# Patient Record
Sex: Male | Born: 1985 | Race: Black or African American | Hispanic: No | Marital: Single | Smoking: Never smoker
Health system: Southern US, Community
[De-identification: ages and names within clinical notes are randomized; demographics above are authoritative.]

---
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
## 2012-08-10 ENCOUNTER — Emergency Department: Payer: Self-pay | Admitting: Emergency Medicine

## 2012-08-10 LAB — URINALYSIS, COMPLETE
Bacteria: NONE SEEN
Bilirubin,UR: NEGATIVE
Blood: NEGATIVE
Glucose,UR: NEGATIVE mg/dL (ref 0–75)
Leukocyte Esterase: NEGATIVE
Nitrite: NEGATIVE
Ph: 6 (ref 4.5–8.0)
Protein: 30
RBC,UR: NONE SEEN /HPF (ref 0–5)
Specific Gravity: 1.032 (ref 1.003–1.030)
Squamous Epithelial: NONE SEEN
WBC UR: 1 /HPF (ref 0–5)

## 2012-08-10 LAB — CBC
HCT: 45 % (ref 40.0–52.0)
HGB: 15.8 g/dL (ref 13.0–18.0)
MCH: 35.6 pg — ABNORMAL HIGH (ref 26.0–34.0)
MCHC: 35.1 g/dL (ref 32.0–36.0)
MCV: 101 fL — ABNORMAL HIGH (ref 80–100)
Platelet: 182 10*3/uL (ref 150–440)
RBC: 4.44 10*6/uL (ref 4.40–5.90)
RDW: 12.4 % (ref 11.5–14.5)
WBC: 9.1 10*3/uL (ref 3.8–10.6)

## 2012-08-10 LAB — COMPREHENSIVE METABOLIC PANEL
Albumin: 4.5 g/dL (ref 3.4–5.0)
Alkaline Phosphatase: 78 U/L (ref 50–136)
Bilirubin,Total: 1.5 mg/dL — ABNORMAL HIGH (ref 0.2–1.0)
Calcium, Total: 9.1 mg/dL (ref 8.5–10.1)
Co2: 24 mmol/L (ref 21–32)
EGFR (African American): >60
EGFR (Non-African Amer.): >60
Osmolality: 274 (ref 275–301)
Potassium: 3.4 mmol/L — ABNORMAL LOW (ref 3.5–5.1)
SGOT(AST): 31 U/L (ref 15–37)
Sodium: 137 mmol/L (ref 136–145)

## 2012-08-10 LAB — LIPASE, BLOOD: Lipase: 98 U/L (ref 73–393)

## 2012-10-10 ENCOUNTER — Emergency Department: Payer: Self-pay | Admitting: Emergency Medicine

## 2012-11-01 ENCOUNTER — Emergency Department: Payer: Self-pay | Admitting: Emergency Medicine

## 2012-11-01 LAB — CBC
HCT: 44.4 % (ref 40.0–52.0)
HGB: 14.9 g/dL (ref 13.0–18.0)
MCH: 34.1 pg — ABNORMAL HIGH (ref 26.0–34.0)
MCHC: 33.7 g/dL (ref 32.0–36.0)
Platelet: 189 10*3/uL (ref 150–440)
WBC: 7.1 10*3/uL (ref 3.8–10.6)

## 2012-11-01 LAB — COMPREHENSIVE METABOLIC PANEL
Albumin: 4.3 g/dL (ref 3.4–5.0)
Alkaline Phosphatase: 88 U/L (ref 50–136)
Anion Gap: 8 (ref 7–16)
BUN: 15 mg/dL (ref 7–18)
Bilirubin,Total: 1.9 mg/dL — ABNORMAL HIGH (ref 0.2–1.0)
Chloride: 105 mmol/L (ref 98–107)
Co2: 25 mmol/L (ref 21–32)
Creatinine: 1.12 mg/dL (ref 0.60–1.30)
EGFR (African American): >60
EGFR (Non-African Amer.): >60
SGPT (ALT): 29 U/L (ref 12–78)
Sodium: 138 mmol/L (ref 136–145)
Total Protein: 8 g/dL (ref 6.4–8.2)

## 2013-06-12 ENCOUNTER — Emergency Department: Payer: Self-pay | Admitting: Emergency Medicine

## 2014-10-19 IMAGING — CR LEFT LITTLE FINGER 2+V
1 series · 3 of 3 positions shown · non-contrast
Comparison: none

REASON FOR EXAM: post reduction
COMMENTS:

PROCEDURE:     DXR - DXR FINGER PINKY 5TH DIGIT LT HA  - October 10, 2012  [DATE]
RESULT:     Findings: 3 views of the left fifth digit are provided. There is
interval reduction of the prior left fifth PIP dislocation. There is a tiny
chip fracture anteromedial to the fifth PIP joint.

[Series 5: x finger pa left · 0.14mm/px · 3 of 3 slices shown]
[im 1/3]
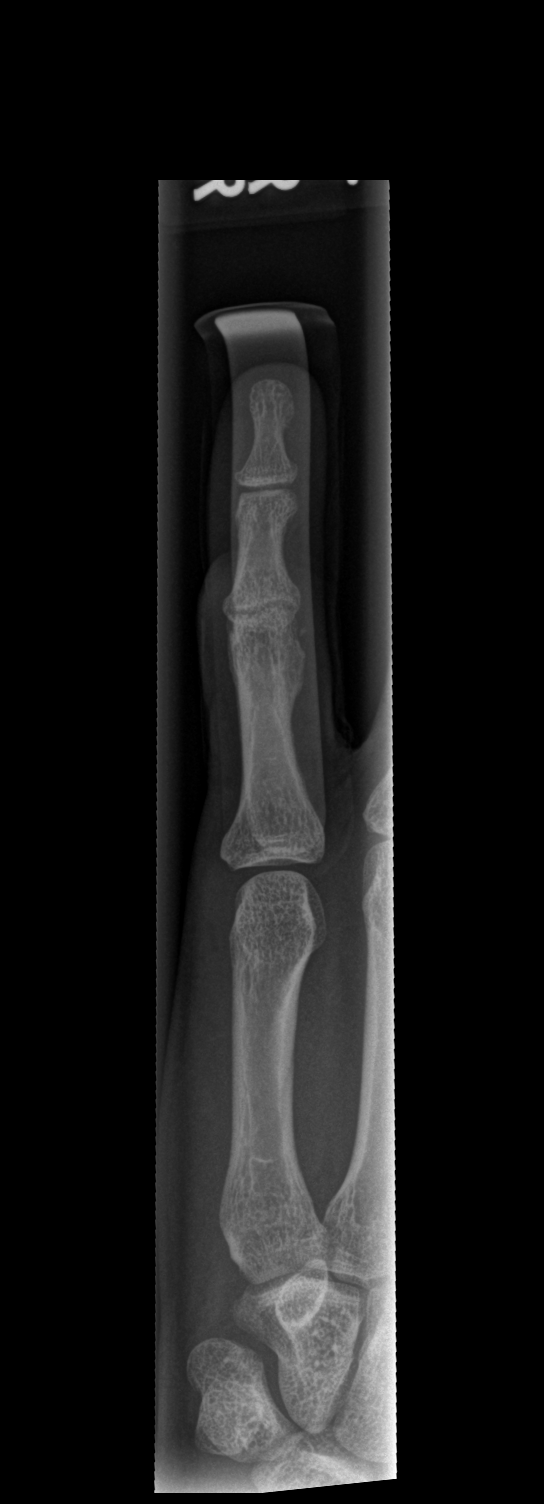
[im 2/3]
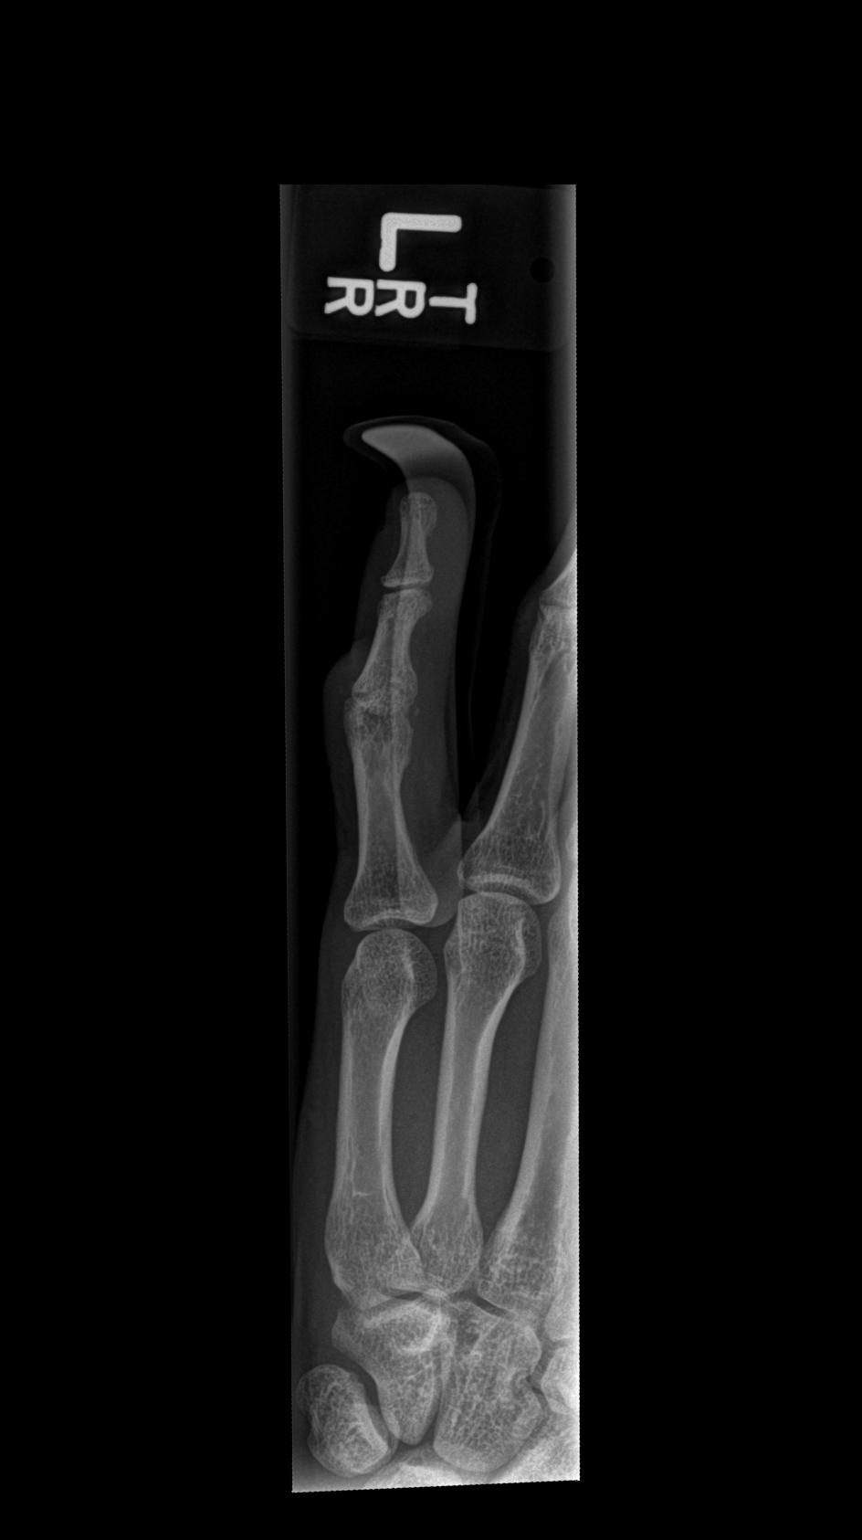
[im 3/3]
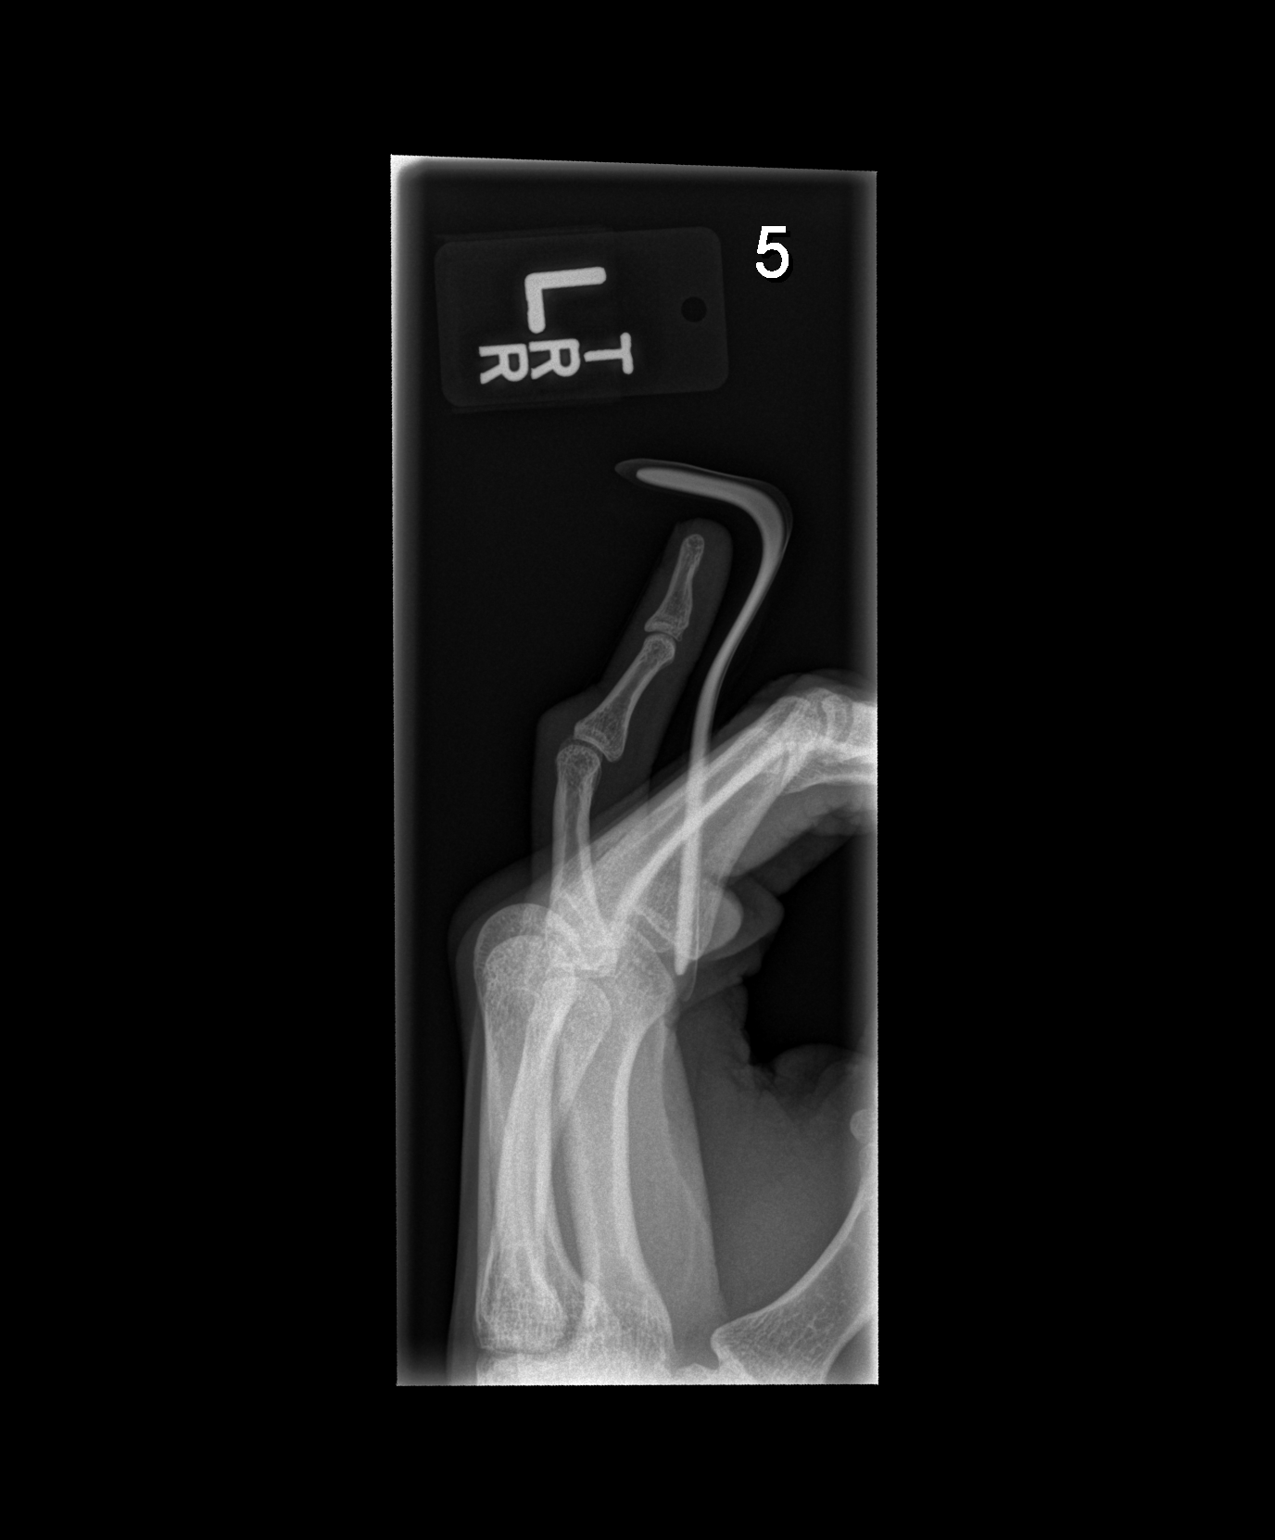

[3 of 3 positions shown; findings below may reference images not displayed]

IMPRESSION: Please see above.
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
## 2021-09-05 ENCOUNTER — Other Ambulatory Visit: Payer: Self-pay

## 2021-09-05 ENCOUNTER — Encounter: Payer: Self-pay | Admitting: Emergency Medicine

## 2021-09-05 ENCOUNTER — Emergency Department
Admission: EM | Admit: 2021-09-05 | Discharge: 2021-09-05 | Disposition: A | Discharge status: Home / Self Care | Payer: Self-pay | Attending: Emergency Medicine | Admitting: Emergency Medicine

## 2021-09-05 DIAGNOSIS — R112 Nausea with vomiting, unspecified: Secondary | ICD-10-CM | POA: Insufficient documentation

## 2021-09-05 DIAGNOSIS — F12188 Cannabis abuse with other cannabis-induced disorder: Secondary | ICD-10-CM | POA: Insufficient documentation

## 2021-09-05 DIAGNOSIS — F1290 Cannabis use, unspecified, uncomplicated: Secondary | ICD-10-CM

## 2021-09-05 LAB — COMPREHENSIVE METABOLIC PANEL
ALT: 48 U/L — ABNORMAL HIGH (ref 0–44)
AST: 45 U/L — ABNORMAL HIGH (ref 15–41)
Albumin: 4.7 g/dL (ref 3.5–5.0)
Alkaline Phosphatase: 81 U/L (ref 38–126)
Anion gap: 10 (ref 5–15)
BUN: 12 mg/dL (ref 6–20)
CO2: 26 mmol/L (ref 22–32)
Calcium: 9.8 mg/dL (ref 8.9–10.3)
Chloride: 101 mmol/L (ref 98–111)
Creatinine, Ser: 1.23 mg/dL (ref 0.61–1.24)
GFR, Estimated: >60 mL/min (ref >60)
Glucose, Bld: 115 mg/dL — ABNORMAL HIGH (ref 70–99)
Potassium: 3.8 mmol/L (ref 3.5–5.1)
Sodium: 137 mmol/L (ref 135–145)
Total Bilirubin: 1.5 mg/dL — ABNORMAL HIGH (ref 0.3–1.2)
Total Protein: 8.1 g/dL (ref 6.5–8.1)

## 2021-09-05 LAB — CBC
HCT: 49.7 % (ref 39.0–52.0)
Hemoglobin: 17.4 g/dL — ABNORMAL HIGH (ref 13.0–17.0)
MCH: 34.8 pg — ABNORMAL HIGH (ref 26.0–34.0)
MCHC: 35 g/dL (ref 30.0–36.0)
MCV: 99.4 fL (ref 80.0–100.0)
Platelets: 242 10*3/uL (ref 150–400)
RBC: 5 MIL/uL (ref 4.22–5.81)
RDW: 11.4 % — ABNORMAL LOW (ref 11.5–15.5)
WBC: 8.8 10*3/uL (ref 4.0–10.5)
nRBC: 0 % (ref 0.0–0.2)

## 2021-09-05 LAB — URINALYSIS, ROUTINE W REFLEX MICROSCOPIC
Bilirubin Urine: NEGATIVE
Glucose, UA: NEGATIVE mg/dL
Hgb urine dipstick: NEGATIVE
Ketones, ur: NEGATIVE mg/dL
Leukocytes,Ua: NEGATIVE
Nitrite: NEGATIVE
Protein, ur: NEGATIVE mg/dL
Specific Gravity, Urine: 1.02 (ref 1.005–1.030)
pH: 7 (ref 5.0–8.0)

## 2021-09-05 LAB — LIPASE, BLOOD: Lipase: 30 U/L (ref 11–51)

## 2021-09-05 MED ORDER — ONDANSETRON HCL 4 MG/2ML IJ SOLN
4.0000 mg | Freq: Once | INTRAMUSCULAR | Status: AC
  Administered 2021-09-05: 13:00:00 4 mg via INTRAVENOUS
  Filled 2021-09-05: qty 2

## 2021-09-05 MED ORDER — SODIUM CHLORIDE 0.9 % IV SOLN
25.0000 mg | Freq: Once | INTRAVENOUS | Status: AC
  Administered 2021-09-05: 15:00:00 25 mg via INTRAVENOUS
  Filled 2021-09-05: qty 1

## 2021-09-05 MED ORDER — CAPSAICIN 0.025 % EX CREA
1.0000 "application " | TOPICAL_CREAM | Freq: Once | CUTANEOUS | Status: AC
  Administered 2021-09-05: 1 via TOPICAL
  Filled 2021-09-05: qty 60

## 2021-09-05 MED ORDER — SODIUM CHLORIDE 0.9 % IV BOLUS
500.0000 mL | Freq: Once | INTRAVENOUS | Status: AC
  Administered 2021-09-05: 14:00:00 500 mL via INTRAVENOUS

## 2021-09-05 MED ORDER — ONDANSETRON 4 MG PO TBDP
4.0000 mg | ORAL_TABLET | Freq: Three times a day (TID) | ORAL | 0 refills | Status: AC | PRN
Start: 2021-09-05 — End: 2021-09-12

## 2021-09-05 NOTE — ED Triage Notes (Signed)
Pt comes into the ED via POV c/o emesis that started this morning.  Pt admits to some abd pain as well with a cramping pain.  Pt denies any alcohol consumption but admits to marijuana use yesterday.

## 2021-09-05 NOTE — ED Notes (Signed)
ED Provider at bedside. 

## 2021-09-05 NOTE — ED Notes (Signed)
Pt aware we need a urine sample and urinal provided.

## 2021-09-05 NOTE — ED Provider Notes (Signed)
[facility] Provider Note    Event Date/Time   First MD Initiated Contact with Patient 09/05/21 1239     (approximate)   History   Chief Complaint Emesis   HPI  Cesar Hanson is a 36 y.o. male, unremarkable medical history, presents emergency department for evaluation of abdominal pain and emesis.  Patient states that he began approximately 12 hours ago.  Reports crampy/colicky type pain in his abdomen with over 8 episodes of nonbilious/nonbloody vomiting.  Denies fever/chills, chest pain, shortness of breath, urinary symptoms, headache, or flank pain.  Of note, patient states that he routinely smokes marijuana twice per day and has been doing so for the past year.  History Limitations: No limitations.      Physical Exam  Triage Vital Signs: ED Triage Vitals  Enc Vitals Group     BP 09/05/21 1231 (!) 134/97     Pulse Rate 09/05/21 1231 91     Resp 09/05/21 1231 20     Temp 09/05/21 1231 98.1 F (36.7 C)     Temp Source 09/05/21 1231 Oral     SpO2 09/05/21 1231 99 %     Weight 09/05/21 1230 190 lb (86.2 kg)     Height 09/05/21 1230 5\' 11"  (1.803 m)     Head Circumference --      Peak Flow --      Pain Score 09/05/21 1229 10     Pain Loc --      Pain Edu? --      Excl. in GC? --     Most recent vital signs: Vitals:   09/05/21 1231 09/05/21 1537  BP: (!) 134/97 (!) 149/73  Pulse: 91 83  Resp: 20 18  Temp: 98.1 F (36.7 C)   SpO2: 99% 100%     Physical Exam Constitutional:      Appearance: Normal appearance.     Comments: Appears uncomfortable.  Actively vomiting.  Pulmonary:     Effort: Pulmonary effort is normal.  Abdominal:     Comments: Abdomen is soft and nontender.  No distention or masses.   Skin:    General: Skin is warm and dry.     Capillary Refill: Capillary refill takes less than 2 seconds.  Neurological:     Mental Status: He is alert. Mental status is at baseline.      ED Results / Procedures / Treatments   Labs (all labs ordered are listed, but only abnormal results are displayed) Labs Reviewed  COMPREHENSIVE METABOLIC PANEL - Abnormal; Notable for the following components:      Result Value   Glucose, Bld 115 (*)    AST 45 (*)    ALT 48 (*)    Total Bilirubin 1.5 (*)    All other components within normal limits  CBC - Abnormal; Notable for the following components:   Hemoglobin 17.4 (*)    MCH 34.8 (*)    RDW 11.4 (*)    All other components within normal limits  LIPASE, BLOOD  URINALYSIS, ROUTINE W REFLEX MICROSCOPIC     EKG Not applicable.   RADIOLOGY ED Provider Interpretation: Not applicable.  No results found.  PROCEDURES:  Critical Care performed: None.  Procedures    MEDICATIONS ORDERED IN ED: Medications  ondansetron (ZOFRAN) injection 4 mg (4 mg Intravenous Given 09/05/21 1305)  capsaicin (ZOSTRIX) 0.025 % cream 1 application (1 application Topical Given 09/05/21 1435)  sodium chloride 0.9 % bolus 500 mL (0 mLs Intravenous  Stopped 09/05/21 1543)  promethazine (PHENERGAN) 25 mg in sodium chloride 0.9 % 50 mL IVPB (0 mg Intravenous Stopped 09/05/21 1543)     IMPRESSION / MDM / ASSESSMENT AND PLAN / ED COURSE  I reviewed the triage vital signs and the nursing notes.                              Cesar Hanson is a 36 y.o. male, unremarkable medical history, presents emergency department for evaluation of abdominal pain and emesis.  Patient states that this began approximately 12 hours ago.  Reports crampy/colicky type pain in his abdomen with over 8 episodes of nonbilious/nonbloody vomiting.  Differential diagnosis includes, but is not limited to, gastroenteritis, cannabinoid hyperemesis syndrome, appendicitis, small bowel obstruction.  Patient appears uncomfortable and is actively vomiting, but does not appear ill or toxic.  Vital signs are within normal limits.  Abdominal exam is unremarkable.  CMP shows mildly elevated AST/ALT and bilirubin,  consistent with history of alcohol use, otherwise unremarkable.  CBC shows no evidence of leukocytosis or anemia.  Lipase unremarkable for evidence of pancreatitis.  Urinalysis shows no evidence of urinary tract infection.  We will go ahead and treat this patient with IV fluids and ondansetron.  Upon reexamination, patient endorses mild improvement of nausea but still no resolution.  We will go ahead and treat with promethazine and capsaicin cream.  History and physical exam suggestive of cannabinoid hyperemesis.  Physical exam not suggestive of any acute or life-threatening pathology.  Lab work-up has been reassuring.  No further imaging indicated at this time.  Upon further reexamination, patient states that his nausea has gone away.  We will plan to discharge this patient with a prescription for ondansetron.  Patient was provided with anticipatory guidance, return precautions, and educational material. Encouraged the patient to return to the emergency department at any time if they begin to experience any new or worsening symptoms.       FINAL CLINICAL IMPRESSION(S) / ED DIAGNOSES   Final diagnoses:  Cannabinoid hyperemesis syndrome     Rx / DC Orders   ED Discharge Orders          Ordered    ondansetron (ZOFRAN-ODT) 4 MG disintegrating tablet  Every 8 hours PRN        09/05/21 1518             Note:  This document was prepared using Dragon voice recognition software and may include unintentional dictation errors.   Varney Daily, [place] 09/05/21 2001    Jene Every, MD 09/06/21 [phone]

## 2021-09-05 NOTE — Discharge Instructions (Addendum)
-  Take ondansetron as needed for nausea/vomiting. -Gradually cut back on marijuana use to prevent recurrent episodes. -Return to the emergency department anytime if you begin to experience any new or worsening symptoms.

## 2022-08-04 ENCOUNTER — Emergency Department
Admission: EM | Admit: 2022-08-04 | Discharge: 2022-08-04 | Disposition: A | Discharge status: Home / Self Care | Payer: Self-pay | Attending: Emergency Medicine | Admitting: Emergency Medicine

## 2022-08-04 ENCOUNTER — Other Ambulatory Visit: Payer: Self-pay

## 2022-08-04 DIAGNOSIS — R197 Diarrhea, unspecified: Secondary | ICD-10-CM | POA: Insufficient documentation

## 2022-08-04 DIAGNOSIS — Z20822 Contact with and (suspected) exposure to covid-19: Secondary | ICD-10-CM | POA: Insufficient documentation

## 2022-08-04 DIAGNOSIS — R109 Unspecified abdominal pain: Secondary | ICD-10-CM | POA: Insufficient documentation

## 2022-08-04 DIAGNOSIS — R7401 Elevation of levels of liver transaminase levels: Secondary | ICD-10-CM | POA: Insufficient documentation

## 2022-08-04 DIAGNOSIS — R824 Acetonuria: Secondary | ICD-10-CM | POA: Insufficient documentation

## 2022-08-04 DIAGNOSIS — R112 Nausea with vomiting, unspecified: Secondary | ICD-10-CM | POA: Insufficient documentation

## 2022-08-04 LAB — URINALYSIS, ROUTINE W REFLEX MICROSCOPIC
Bilirubin Urine: NEGATIVE
Glucose, UA: NEGATIVE mg/dL
Hgb urine dipstick: NEGATIVE
Ketones, ur: 20 mg/dL — AB
Leukocytes,Ua: NEGATIVE
Nitrite: NEGATIVE
Protein, ur: 100 mg/dL — AB
Specific Gravity, Urine: 1.03 (ref 1.005–1.030)
pH: 8 (ref 5.0–8.0)

## 2022-08-04 LAB — CBC
HCT: 46.6 % (ref 39.0–52.0)
Hemoglobin: 16 g/dL (ref 13.0–17.0)
MCH: 33.8 pg (ref 26.0–34.0)
MCHC: 34.3 g/dL (ref 30.0–36.0)
MCV: 98.5 fL (ref 80.0–100.0)
Platelets: 223 10*3/uL (ref 150–400)
RBC: 4.73 MIL/uL (ref 4.22–5.81)
RDW: 11.2 % — ABNORMAL LOW (ref 11.5–15.5)
WBC: 9.3 10*3/uL (ref 4.0–10.5)
nRBC: 0 % (ref 0.0–0.2)

## 2022-08-04 LAB — COMPREHENSIVE METABOLIC PANEL
ALT: 56 U/L — ABNORMAL HIGH (ref 0–44)
AST: 49 U/L — ABNORMAL HIGH (ref 15–41)
Albumin: 4.6 g/dL (ref 3.5–5.0)
Alkaline Phosphatase: 69 U/L (ref 38–126)
Anion gap: 11 (ref 5–15)
BUN: 15 mg/dL (ref 6–20)
CO2: 23 mmol/L (ref 22–32)
Calcium: 9.9 mg/dL (ref 8.9–10.3)
Chloride: 104 mmol/L (ref 98–111)
Creatinine, Ser: 1.19 mg/dL (ref 0.61–1.24)
GFR, Estimated: >60 mL/min (ref >60)
Glucose, Bld: 128 mg/dL — ABNORMAL HIGH (ref 70–99)
Potassium: 4.3 mmol/L (ref 3.5–5.1)
Sodium: 138 mmol/L (ref 135–145)
Total Bilirubin: 1.9 mg/dL — ABNORMAL HIGH (ref 0.3–1.2)
Total Protein: 8 g/dL (ref 6.5–8.1)

## 2022-08-04 LAB — RESP PANEL BY RT-PCR (RSV, FLU A&B, COVID)  RVPGX2
Influenza A by PCR: NEGATIVE
Influenza B by PCR: NEGATIVE
Resp Syncytial Virus by PCR: NEGATIVE
SARS Coronavirus 2 by RT PCR: NEGATIVE

## 2022-08-04 LAB — LIPASE, BLOOD: Lipase: 26 U/L (ref 11–51)

## 2022-08-04 MED ORDER — ONDANSETRON 4 MG PO TBDP
4.0000 mg | ORAL_TABLET | Freq: Once | ORAL | Status: AC
  Administered 2022-08-04: 4 mg via ORAL
  Filled 2022-08-04: qty 1

## 2022-08-04 MED ORDER — DROPERIDOL 2.5 MG/ML IJ SOLN
2.5000 mg | Freq: Once | INTRAMUSCULAR | Status: AC
  Administered 2022-08-04: 2.5 mg via INTRAVENOUS
  Filled 2022-08-04: qty 2

## 2022-08-04 MED ORDER — ONDANSETRON 4 MG PO TBDP: 4.0000 mg | ORAL_TABLET | Freq: Three times a day (TID) | ORAL | 0 refills | Status: DC | PRN

## 2022-08-04 MED ORDER — LACTATED RINGERS IV BOLUS
1000.0000 mL | Freq: Once | INTRAVENOUS | Status: AC
  Administered 2022-08-04: 1000 mL via INTRAVENOUS

## 2022-08-04 NOTE — ED Notes (Signed)
Pt stated he was going to call 911 so he could get to a room. This RN informed him, that EMS would bring him back to the waiting room. Emergency planning/management officer on site came and talked with the pt.

## 2022-08-04 NOTE — ED Triage Notes (Signed)
Pt states he has been vomiting and has abd pain since last night around 11- pt states that he cannot keep anything down and has nothing left in his stomach to throw up

## 2022-08-04 NOTE — ED Provider Triage Note (Signed)
Emergency Medicine Provider Triage Evaluation Note  Cesar Hanson, a 36 y.o. male  was evaluated in triage.  Pt complains of nausea vomiting, and diarrhea.  He notes onset of symptoms last night about 11 PM, and has had several episodes of nonbloody, nonbilious emesis.  He denies any associated FCS  Review of Systems  Positive: NVD, abd pain  Negative: FCS  Physical Exam  BP 120/86 (BP Location: Right Arm)   Pulse 85   Temp 97.7 F (36.5 C) (Oral)   Resp 18   Ht 5\' 11"  (1.803 m)   Wt 83.9 kg   SpO2 99%   BMI 25.80 kg/m  Gen:   Awake, no distress   Resp:  Normal effort  MSK:   Moves extremities without difficulty  Other:    Medical Decision Making  Medically screening exam initiated at 12:58 PM.  Appropriate orders placed.  was informed that the remainder of the evaluation will be completed by another provider, this initial triage assessment does not replace that evaluation, and the importance of remaining in the ED until their evaluation is complete.  Patient to the ED for evaluation of nonbloody, nonbilious emesis.  He reports some associated crampy abdominal pain as well as intermittent episodes of diarrhea   Cesar Hanson, Manfred Shirts, PA-C 08/04/22 1302

## 2022-08-04 NOTE — ED Triage Notes (Addendum)
Pt in waiting room bathroom stating "I keep having these "blackout." Pt placed in wheelchair. Brought out to view of first nurse, got out of wheelchair and flopped onto the floor. This RN went to assess pt, pt alert and answering questions and got back into wheelchair by himself and asking for "something" to drink now.

## 2022-08-04 NOTE — ED Provider Notes (Signed)
[facility] Provider Note    Event Date/Time   First MD Initiated Contact with Patient 08/04/22 1635     (approximate)   History   Abdominal Pain   HPI  MERL BOMMARITO is a 36 y.o. male with past medical history of cannabis use who presents with nausea and vomiting.  Symptoms started yesterday.  He has had multiple episodes of vomiting inability tolerate p.o.  He did see some specks of blood in it yesterday.  Denies melena.  Had some diarrhea as well.  He is having cramping abdominal pain when he has to throw up but otherwise does not have significant pain.  His children had a stomach virus last week.  Denies fevers chills.     History reviewed. No pertinent past medical history.  There are no problems to display for this patient.    Physical Exam  Triage Vital Signs: ED Triage Vitals  Enc Vitals Group     BP 08/04/22 1253 120/86     Pulse Rate 08/04/22 1251 85     Resp 08/04/22 1251 18     Temp 08/04/22 1251 97.7 F (36.5 C)     Temp Source 08/04/22 1251 Oral     SpO2 08/04/22 1251 99 %     Weight 08/04/22 1253 185 lb (83.9 kg)     Height 08/04/22 1253 5\' 11"  (1.803 m)     Head Circumference --      Peak Flow --      Pain Score 08/04/22 1253 10     Pain Loc --      Pain Edu? --      Excl. in GC? --     Most recent vital signs: Vitals:   08/04/22 1251 08/04/22 1253  BP:  120/86  Pulse: 85   Resp: 18   Temp: 97.7 F (36.5 C)   SpO2: 99%      General: Awake, no distress.  CV:  Good peripheral perfusion.  Resp:  Normal effort.  Abd:  No distention.  Abdomen is soft and nontender throughout Neuro:             Awake, Alert, Oriented x 3  Other:  Dry mucous membranes   ED Results / Procedures / Treatments  Labs (all labs ordered are listed, but only abnormal results are displayed) Labs Reviewed  COMPREHENSIVE METABOLIC PANEL - Abnormal; Notable for the following components:      Result Value   Glucose, Bld 128 (*)    AST  49 (*)    ALT 56 (*)    Total Bilirubin 1.9 (*)    All other components within normal limits  CBC - Abnormal; Notable for the following components:   RDW 11.2 (*)    All other components within normal limits  URINALYSIS, ROUTINE W REFLEX MICROSCOPIC - Abnormal; Notable for the following components:   Color, Urine YELLOW (*)    APPearance HAZY (*)    Ketones, ur 20 (*)    Protein, ur 100 (*)    Bacteria, UA RARE (*)    All other components within normal limits  RESP PANEL BY RT-PCR (RSV, FLU A&B, COVID)  RVPGX2  LIPASE, BLOOD     EKG     RADIOLOGY    PROCEDURES:  Critical Care performed: No  Procedures   MEDICATIONS ORDERED IN ED: Medications  droperidol (INAPSINE) 2.5 MG/ML injection 2.5 mg (has no administration in time range)  lactated ringers bolus 1,000 mL (has no  administration in time range)  ondansetron (ZOFRAN-ODT) disintegrating tablet 4 mg (4 mg Oral Given 08/04/22 1302)     IMPRESSION / MDM / ASSESSMENT AND PLAN / ED COURSE  I reviewed the triage vital signs and the nursing notes.                              Patient's presentation is most consistent with acute complicated illness / injury requiring diagnostic workup.  Differential diagnosis includes, but is not limited to, cannabinol hyperemesis syndrome, gastroenteritis, gastritis, pancreatitis, cyclic vomiting syndrome  The patient is a 36 year old male presents with nausea and vomiting since yesterday.  Has not been able to tolerate much p.o.  Still some streaks of blood in it yesterday but nothing today denies melena.  Was having some diarrhea as well not have significant abdominal pain outside from cramping pain when he is actually vomiting.  Patient denies marijuana use but I see he has been seen for cannabinoid hyperemesis syndrome in the past and does smell of marijuana today.  Overall he looks well does have dry mucous membranes but his abdominal exam is benign.  Labs overall are reassuring.   Does have 20 ketones in the urine, AST ALT just mildly elevated no leukocytosis.  Plan to give droperidol and a fluid bolus.  Patient tolerating fluid and feeling better after droperidol.  Will discharge with Zofran.  Discussed clear liquid diet and slowly advancing diet.  He is appropriate for discharge.  Clinical Course as of 08/04/22 1821  Sun Aug 04, 2022  1812 RBC: 4.73 [KM]    Clinical Course User Index [KM] Rada Hay, MD     FINAL CLINICAL IMPRESSION(S) / ED DIAGNOSES   Final diagnoses:  Nausea vomiting and diarrhea     Rx / DC Orders   ED Discharge Orders     None        Note:  This document was prepared using Dragon voice recognition software and may include unintentional dictation errors.   Rada Hay, MD 08/04/22 [phone]

## 2023-11-01 ENCOUNTER — Emergency Department
Admission: EM | Admit: 2023-11-01 | Discharge: 2023-11-01 | Disposition: A | Discharge status: Home / Self Care | Attending: Emergency Medicine | Admitting: Emergency Medicine

## 2023-11-01 ENCOUNTER — Other Ambulatory Visit: Payer: Self-pay

## 2023-11-01 ENCOUNTER — Emergency Department

## 2023-11-01 DIAGNOSIS — R1084 Generalized abdominal pain: Secondary | ICD-10-CM | POA: Diagnosis present

## 2023-11-01 DIAGNOSIS — E876 Hypokalemia: Secondary | ICD-10-CM | POA: Diagnosis not present

## 2023-11-01 DIAGNOSIS — D72819 Decreased white blood cell count, unspecified: Secondary | ICD-10-CM | POA: Diagnosis not present

## 2023-11-01 DIAGNOSIS — J101 Influenza due to other identified influenza virus with other respiratory manifestations: Secondary | ICD-10-CM | POA: Insufficient documentation

## 2023-11-01 DIAGNOSIS — N179 Acute kidney failure, unspecified: Secondary | ICD-10-CM | POA: Insufficient documentation

## 2023-11-01 DIAGNOSIS — E871 Hypo-osmolality and hyponatremia: Secondary | ICD-10-CM | POA: Diagnosis not present

## 2023-11-01 DIAGNOSIS — R7401 Elevation of levels of liver transaminase levels: Secondary | ICD-10-CM | POA: Diagnosis not present

## 2023-11-01 LAB — URINALYSIS, ROUTINE W REFLEX MICROSCOPIC
Bacteria, UA: NONE SEEN
Bilirubin Urine: NEGATIVE
Glucose, UA: NEGATIVE mg/dL
Hgb urine dipstick: NEGATIVE
Ketones, ur: 20 mg/dL — AB
Leukocytes,Ua: NEGATIVE
Nitrite: NEGATIVE
Protein, ur: 30 mg/dL — AB
Specific Gravity, Urine: 1.031 — ABNORMAL HIGH (ref 1.005–1.030)
pH: 5 (ref 5.0–8.0)

## 2023-11-01 LAB — COMPREHENSIVE METABOLIC PANEL
ALT: 46 U/L — ABNORMAL HIGH (ref 0–44)
AST: 48 U/L — ABNORMAL HIGH (ref 15–41)
Albumin: 3.9 g/dL (ref 3.5–5.0)
Alkaline Phosphatase: 60 U/L (ref 38–126)
Anion gap: 9 (ref 5–15)
BUN: 14 mg/dL (ref 6–20)
CO2: 20 mmol/L — ABNORMAL LOW (ref 22–32)
Calcium: 8.6 mg/dL — ABNORMAL LOW (ref 8.9–10.3)
Chloride: 101 mmol/L (ref 98–111)
Creatinine, Ser: 1.28 mg/dL — ABNORMAL HIGH (ref 0.61–1.24)
GFR, Estimated: >60 mL/min (ref >60)
Glucose, Bld: 143 mg/dL — ABNORMAL HIGH (ref 70–99)
Potassium: 3.3 mmol/L — ABNORMAL LOW (ref 3.5–5.1)
Sodium: 130 mmol/L — ABNORMAL LOW (ref 135–145)
Total Bilirubin: 0.9 mg/dL (ref 0.0–1.2)
Total Protein: 7.3 g/dL (ref 6.5–8.1)

## 2023-11-01 LAB — CBC
HCT: 47.6 % (ref 39.0–52.0)
Hemoglobin: 17 g/dL (ref 13.0–17.0)
MCH: 34.3 pg — ABNORMAL HIGH (ref 26.0–34.0)
MCHC: 35.7 g/dL (ref 30.0–36.0)
MCV: 96.2 fL (ref 80.0–100.0)
Platelets: 187 10*3/uL (ref 150–400)
RBC: 4.95 MIL/uL (ref 4.22–5.81)
RDW: 11.4 % — ABNORMAL LOW (ref 11.5–15.5)
WBC: 2.8 10*3/uL — ABNORMAL LOW (ref 4.0–10.5)
nRBC: 0 % (ref 0.0–0.2)

## 2023-11-01 LAB — RESP PANEL BY RT-PCR (RSV, FLU A&B, COVID)  RVPGX2
Influenza A by PCR: NEGATIVE
Influenza B by PCR: POSITIVE — AB
Resp Syncytial Virus by PCR: NEGATIVE
SARS Coronavirus 2 by RT PCR: NEGATIVE

## 2023-11-01 LAB — RAPID HIV SCREEN (HIV 1/2 AB+AG)
HIV 1/2 Antibodies: NONREACTIVE
HIV-1 P24 Antigen - HIV24: NONREACTIVE

## 2023-11-01 LAB — LIPASE, BLOOD: Lipase: 42 U/L (ref 11–51)

## 2023-11-01 MED ORDER — ONDANSETRON 4 MG PO TBDP: 4.0000 mg | ORAL_TABLET | Freq: Three times a day (TID) | ORAL | 0 refills | Status: AC | PRN

## 2023-11-01 MED ORDER — SODIUM CHLORIDE 0.9 % IV BOLUS (SEPSIS)
1000.0000 mL | Freq: Once | INTRAVENOUS | Status: AC
  Administered 2023-11-01: 1000 mL via INTRAVENOUS

## 2023-11-01 MED ORDER — ONDANSETRON HCL 4 MG/2ML IJ SOLN
4.0000 mg | Freq: Once | INTRAMUSCULAR | Status: AC
  Administered 2023-11-01: 4 mg via INTRAVENOUS
  Filled 2023-11-01: qty 2

## 2023-11-01 MED ORDER — KETOROLAC TROMETHAMINE 15 MG/ML IJ SOLN
15.0000 mg | Freq: Once | INTRAMUSCULAR | Status: AC
  Administered 2023-11-01: 15 mg via INTRAVENOUS
  Filled 2023-11-01: qty 1

## 2023-11-01 MED ORDER — IOHEXOL 300 MG/ML  SOLN
100.0000 mL | Freq: Once | INTRAMUSCULAR | Status: AC | PRN
  Administered 2023-11-01: 100 mL via INTRAVENOUS

## 2023-11-01 MED ORDER — FENTANYL CITRATE PF 50 MCG/ML IJ SOSY
50.0000 ug | PREFILLED_SYRINGE | Freq: Once | INTRAMUSCULAR | Status: AC
  Administered 2023-11-01: 50 ug via INTRAVENOUS
  Filled 2023-11-01: qty 1

## 2023-11-01 NOTE — ED Notes (Signed)
 Pt to CT

## 2023-11-01 NOTE — ED Provider Notes (Addendum)
 [facility] Provider Note    Event Date/Time   First MD Initiated Contact with Patient 11/01/23 [phone]     (approximate)   History   Abdominal Pain   HPI  Cesar Hanson is a 38 y.o. male who is otherwise healthy comes in with generalized abdominal pain.  Patient reports lower abdominal pain has been going on since Thursday.  He states it is mostly on the left and right lower abdomen.  He denies ever having anything like this before.  He contrary to triage note denies any diarrhea.  He reports just more feeling cramping and difficulty getting the stool out.  He states that he is actually also not had any vomiting is more of just feeling nauseous.  He denies any known fevers.  He does report some generalized weakness.  He denies any known sick contacts.  He denies any new sexual contacts.  He states that he is sexually active with women only.   Physical Exam   Triage Vital Signs: ED Triage Vitals  Encounter Vitals Group     BP 11/01/23 0625 (!) 144/82     Systolic BP Percentile --      Diastolic BP Percentile --      Pulse Rate 11/01/23 0625 83     Resp 11/01/23 0625 16     Temp 11/01/23 0625 98.7 F (37.1 C)     Temp Source 11/01/23 0625 Oral     SpO2 11/01/23 0625 97 %     Weight 11/01/23 0614 190 lb (86.2 kg)     Height 11/01/23 0614 5\' 10"  (1.778 m)     Head Circumference --      Peak Flow --      Pain Score 11/01/23 0614 10     Pain Loc --      Pain Education --      Exclude from Growth Chart --     Most recent vital signs: Vitals:   11/01/23 0625  BP: (!) 144/82  Pulse: 83  Resp: 16  Temp: 98.7 F (37.1 C)  SpO2: 97%     General: Awake, no distress.  CV:  Good peripheral perfusion.  Resp:  Normal effort.  Abd:  No distention.  Tender in the lower abdomen more on the left a little bit on the right.  No upper abdominal tenderness Other:     ED Results / Procedures / Treatments   Labs (all labs ordered are listed, but only  abnormal results are displayed) Labs Reviewed  COMPREHENSIVE METABOLIC PANEL - Abnormal; Notable for the following components:      Result Value   Sodium 130 (*)    Potassium 3.3 (*)    CO2 20 (*)    Glucose, Bld 143 (*)    Creatinine, Ser 1.28 (*)    Calcium 8.6 (*)    AST 48 (*)    ALT 46 (*)    All other components within normal limits  CBC - Abnormal; Notable for the following components:   WBC 2.8 (*)    MCH 34.3 (*)    RDW 11.4 (*)    All other components within normal limits  LIPASE, BLOOD  URINALYSIS, ROUTINE W REFLEX MICROSCOPIC        RADIOLOGY I have reviewed the ct  personally and interpreted positive kidney stone in the kidney  IMPRESSION: 1. No acute abdominopelvic findings. 2. Nonobstructing 4 mm stone in the lower pole right kidney.   PROCEDURES:  Critical  Care performed: No  Procedures   MEDICATIONS ORDERED IN ED: Medications  ketorolac (TORADOL) 15 MG/ML injection 15 mg (has no administration in time range)  sodium chloride 0.9 % bolus 1,000 mL (1,000 mLs Intravenous New Bag/Given 11/01/23 0645)  ondansetron (ZOFRAN) injection 4 mg (4 mg Intravenous Given 11/01/23 0645)  fentaNYL (SUBLIMAZE) injection 50 mcg (50 mcg Intravenous Given 11/01/23 0743)  iohexol (OMNIPAQUE) 300 MG/ML solution 100 mL (100 mLs Intravenous Contrast Given 11/01/23 0801)     IMPRESSION / MDM / ASSESSMENT AND PLAN / ED COURSE  I reviewed the triage vital signs and the nursing notes.   Patient's presentation is most consistent with acute presentation with potential threat to life or bodily function.   Differential is COVID, flu, electro abnormalities, AKI.  Given lower abdominal tenderness without any overt gastritis symptoms we discussed CT imaging to rule out appendicitis, diverticulitis.  Patient is comfortable with plan.  Patient given some IV fluids IV fentanyl IV Zofran to help with symptoms.  CBC shows slightly low white count at 2.8.  Lipase is normal CMP does show  some elevation of creatinine and low sodium, potassium.  LFTs are slightly elevated but similar to 1 year ago.  Given elevated LFTs patient denies any IV drug use, excessive Tylenol use, right upper quadrant pain, alcohol use.  He has had these LFT elevation previously but states that he was not aware of it.  Given the low white count he seems low risk for HIV but patient states that he has not been tested in many years and is requesting to be tested today.  9:03 AM patient is flu positive CT imaging is reassuring discussed incidental kidney stone with patient.  Patient reports feeling better.  Will do a trial of p.o., dose of Toradol to help with any cramping.  Once HIV is negative patient will be discharged home if patient is tolerating p.o. for his flu- we discussed adequate hydration with Gatorade, Pedialyte, reducing dose of ibuprofen due to slight AKI, and reduced dosing of Tylenol given elevated LFTs that are chronic.     FINAL CLINICAL IMPRESSION(S) / ED DIAGNOSES   Final diagnoses:  Influenza B  AKI (acute kidney injury) (HCC)  Hyponatremia     Rx / DC Orders   ED Discharge Orders     None        Note:  This document was prepared using Dragon voice recognition software and may include unintentional dictation errors.   Concha Se, MD 11/01/23 [id number]    Concha Se, MD 11/01/23 [phone]

## 2023-11-01 NOTE — ED Triage Notes (Signed)
 Pt to ED by POV from home with generalized abdominal pain, diarrhea and nausea since Thursday. Arrives A+O, VSS, NADN.

## 2023-11-01 NOTE — Discharge Instructions (Addendum)
 Use show signs of some dehydration it is important that you drink plenty of Gatorade, Pedialyte I prescribed some nausea medicine to help facilitate this.  You can occasionally use ibuprofen as needed for muscle aches-this can be hard on the kidneys and so try to limit how much you need.  You can take 400 mg every 8 hours for 2 to 3 days.  We discussed limiting your Tylenol use and try to use sparingly given your elevated liver test.  But occasional use less than 2 g a day for 2 to 3 days is okay.   Return to the ER if develop worsening symptoms or any other concerns   IMPRESSION: 1. No acute abdominopelvic findings. 2. Nonobstructing 4 mm stone in the lower pole right kidney.
